# Patient Record
Sex: Female | Born: 1980 | Hispanic: No | Marital: Single | Smoking: Current every day smoker
Health system: Southern US, Community
[De-identification: ages and names within clinical notes are randomized; demographics above are authoritative.]

## PROBLEM LIST (undated history)

## (undated) DIAGNOSIS — E079 Disorder of thyroid, unspecified: Secondary | ICD-10-CM

---

## 2017-11-05 ENCOUNTER — Ambulatory Visit (INDEPENDENT_AMBULATORY_CARE_PROVIDER_SITE_OTHER): Payer: BLUE CROSS/BLUE SHIELD

## 2017-11-05 ENCOUNTER — Encounter (HOSPITAL_COMMUNITY): Payer: Self-pay | Admitting: *Deleted

## 2017-11-05 ENCOUNTER — Ambulatory Visit (HOSPITAL_COMMUNITY)
Admission: EM | Admit: 2017-11-05 | Discharge: 2017-11-05 | Disposition: A | Discharge status: Home / Self Care | Payer: BLUE CROSS/BLUE SHIELD | Attending: Family Medicine | Admitting: Family Medicine

## 2017-11-05 DIAGNOSIS — M5442 Lumbago with sciatica, left side: Secondary | ICD-10-CM

## 2017-11-05 DIAGNOSIS — M542 Cervicalgia: Secondary | ICD-10-CM

## 2017-11-05 DIAGNOSIS — G8929 Other chronic pain: Secondary | ICD-10-CM

## 2017-11-05 HISTORY — DX: Disorder of thyroid, unspecified: E07.9

## 2017-11-05 MED ORDER — KETOROLAC TROMETHAMINE 30 MG/ML IJ SOLN
30.0000 mg | Freq: Once | INTRAMUSCULAR | Status: AC
  Administered 2017-11-05: 30 mg via INTRAMUSCULAR

## 2017-11-05 MED ORDER — PREDNISONE 10 MG (21) PO TBPK: ORAL_TABLET | ORAL | 0 refills | Status: AC

## 2017-11-05 MED ORDER — KETOROLAC TROMETHAMINE 30 MG/ML IJ SOLN
INTRAMUSCULAR | Status: AC
  Filled 2017-11-05: qty 1

## 2017-11-05 MED ORDER — MELOXICAM 7.5 MG PO TABS: 7.5000 mg | ORAL_TABLET | Freq: Every day | ORAL | 0 refills | Status: AC

## 2017-11-05 NOTE — ED Triage Notes (Signed)
Patient reports left lower back pain and left leg pain. Reports this has been going on for over year. Had been seeing a chiropractor but has not been able to afford lately. Patient reports 3 bulging discs. Patient reports intermittent shooting pain down left leg. States currently she has lower and mid central pain-feels like a sharp cutting feeling. Pt has been taking ibuprofen for pain relief.

## 2017-11-05 NOTE — ED Provider Notes (Addendum)
[facility]    CSN: [id number] Arrival date & time: 11/05/17  1113     History   Chief Complaint Chief Complaint  Patient presents with  . Back Pain    HPI Kimberly Stout is a 37 y.o. female.   Patient is a 37 year old female presents with chronic back pain.  This pain is been ongoing for over a year.  She was previously seeing a chiropractor for the adjustments that helped with the pain.  She has had x-rays in the past that confirmed bulging disks.  She has been unable to see her chiropractor due to insurance issues and expense.  Reports worsening lumbar and cervical pain over the past 2 weeks.  She has had some radiation into left upper thigh. Sharp, stabbing in nature.   She has some numbness and tingling in the fingers and toes at times. She contributes this to standing long hours.   She denies any loss of bowel or bladder function. She denies any fever, chills, dysuria, hematuria, urinary frequency,  vaginal bleeding or vaginal discharge. She denies any rashes or color change.   She works long hours and stands about 9-10 of those hours daily. She has been taking ibuprofen for the pain with minimal relief.   ROS per HPI      Past Medical History:  Diagnosis Date  . Thyroid disease     There are no active problems to display for this patient.   History reviewed. No pertinent surgical history.  OB History   None      Home Medications    Prior to Admission medications   Medication Sig Start Date End Date Taking? Authorizing Provider  ibuprofen (ADVIL,MOTRIN) 800 MG tablet Take 800 mg by mouth every 8 (eight) hours as needed.   Yes [provider]  traZODone (DESYREL) 150 MG tablet Take 200 mg by mouth at bedtime.   Yes [provider]  meloxicam (MOBIC) 7.5 MG tablet Take 1 tablet (7.5 mg total) by mouth daily. 11/05/17   Jadore Mcguffin, Gloris Manchester A, NP  predniSONE (STERAPRED UNI-PAK 21 TAB) 10 MG (21) TBPK tablet 6 tabs for 1 day, then 5 tabs for 1 das,  then 4 tabs for 1 day, then 3 tabs for 1 day, 2 tabs for 1 day, then 1 tab for 1 day 11/05/17   Janace Aris, NP    Family History Family History  Problem Relation Age of Onset  . Cancer Mother   . Hypertension Father     Social History Social History   Tobacco Use  . Smoking status: Current Every Day Smoker    Types: E-cigarettes  . Smokeless tobacco: Never Used  Substance Use Topics  . Alcohol use: Not Currently  . Drug use: Not on file     Allergies   Patient has no known allergies.   Review of Systems Review of Systems   Physical Exam Triage Vital Signs ED Triage Vitals  Enc Vitals Group     BP 11/05/17 1236 134/90     Pulse Rate 11/05/17 1236 78     Resp 11/05/17 1236 17     Temp 11/05/17 1236 97.8 F (36.6 C)     Temp Source 11/05/17 1236 Oral     SpO2 11/05/17 1236 100 %     Weight --      Height --      Head Circumference --      Peak Flow --      Pain Score 11/05/17  1232 7     Pain Loc --      Pain Edu? --      Excl. in GC? --    No data found.  Updated Vital Signs BP 134/90 (BP Location: Right Arm)   Pulse 78   Temp 97.8 F (36.6 C) (Oral)   Resp 17   LMP 11/05/2017 (Exact Date)   SpO2 100%   Visual Acuity Right Eye Distance:   Left Eye Distance:   Bilateral Distance:    Right Eye Near:   Left Eye Near:    Bilateral Near:     Physical Exam  Constitutional: She appears well-developed and well-nourished.  Very pleasant. Non toxic or ill appearing.     HENT:  Head: Normocephalic and atraumatic.  Eyes: Conjunctivae are normal.  Neck: Normal range of motion. Neck supple.  mildly tender to cervical spine.  Good ROM   Cardiovascular: Normal rate and regular rhythm.  Pulmonary/Chest: Effort normal and breath sounds normal.  Musculoskeletal: Normal range of motion. She exhibits tenderness. She exhibits no edema or deformity.  Good flexion, extension and rotation of the thoracic and lumbar spine. Mildly tender to lumbar spine and  paravertebral muscles. No swelling, bruising, deformity. No erythema. Sensation intact.   Neurological: She is alert. No sensory deficit.  Skin: Skin is warm and dry.  Psychiatric: She has a normal mood and affect.  Nursing note and vitals reviewed.    UC Treatments / Results  Labs (all labs ordered are listed, but only abnormal results are displayed) Labs Reviewed - No data to display  EKG None  Radiology Dg Cervical Spine Complete  Result Date: 11/05/2017 CLINICAL DATA:  Chronic neck pain without known injury. EXAM: CERVICAL SPINE - COMPLETE 4+ VIEW COMPARISON:  None. FINDINGS: There is no evidence of cervical spine fracture or prevertebral soft tissue swelling. Alignment is normal. No other significant bone abnormalities are identified. IMPRESSION: Negative cervical spine radiographs. Electronically Signed   By: Lupita Raider, M.D.   On: 11/05/2017 14:00   Dg Lumbar Spine Complete  Result Date: 11/05/2017 CLINICAL DATA:  Chronic greater than 1 year history of low back pain which acutely worsened approximately 2 weeks ago. No known injuries. EXAM: LUMBAR SPINE - COMPLETE 4+ VIEW COMPARISON:  None. FINDINGS: Five non rib-bearing lumbar vertebrae with anatomic alignment. No fractures. Well-preserved disk spaces. No pars defects. No significant facet arthropathy. No significant spondylosis. Visualized sacroiliac joints intact. IMPRESSION: Normal examination. Electronically Signed   By: Hulan Saas M.D.   On: 11/05/2017 14:11    Procedures Procedures (including critical care time)  Medications Ordered in UC Medications  ketorolac (TORADOL) 30 MG/ML injection 30 mg (30 mg Intramuscular Given 11/05/17 1411)    Initial Impression / Assessment and Plan / UC Course  I have reviewed the triage vital signs and the nursing notes.  Pertinent labs & imaging results that were available during my care of the patient were reviewed by me and considered in my medical decision making (see  chart for details).     X ray due to hx of bulging disc and new cervical pain. X rays normal Cervical and lumbar pain with radiculopathy Toradol injection in the clinic Prednisone taper and meloxicam to start if pain continues after the taper is finished.  She may need to return to chiropractor since that was helping or physical therapy.  If the pain continues she was instructed to follow up with PCP for more imaging and evaluation.    Final  Clinical Impressions(s) / UC Diagnoses   Final diagnoses:  Chronic bilateral low back pain with left-sided sciatica     Discharge Instructions     Your x-rays were normal We will treat you with prednisone for the next 6 days to see if this helps Once you are done with the prednisone you can start taking meloxicam if you are still having discomfort Stretching and heat/ice could help Try to get back into your chiropractor or possibly physical therapy for continued or worsening symptoms.  Follow up as needed for continued or worsening symptoms      ED Prescriptions    Medication Sig Dispense Auth. Provider   predniSONE (STERAPRED UNI-PAK 21 TAB) 10 MG (21) TBPK tablet 6 tabs for 1 day, then 5 tabs for 1 das, then 4 tabs for 1 day, then 3 tabs for 1 day, 2 tabs for 1 day, then 1 tab for 1 day 21 tablet Sharnell Knight A, NP   meloxicam (MOBIC) 7.5 MG tablet Take 1 tablet (7.5 mg total) by mouth daily. 15 tablet Janace Aris, NP     Controlled Substance Prescriptions [place] Controlled Substance Registry consulted? no   Janace Aris, NP 11/05/17 2126    Janace Aris, NP 11/05/17 2129

## 2017-11-05 NOTE — Discharge Instructions (Addendum)
Your x-rays were normal We will treat you with prednisone for the next 6 days to see if this helps Once you are done with the prednisone you can start taking meloxicam if you are still having discomfort Stretching and heat/ice could help Try to get back into your chiropractor or possibly physical therapy for continued or worsening symptoms.  Follow up as needed for continued or worsening symptoms

## 2017-11-05 NOTE — ED Notes (Signed)
Patient transported to X-ray traci bast , np aware of medication delay and why

## 2017-11-05 NOTE — ED Notes (Signed)
Patient transported to X-ray.  Patient is not in treatment room.   

## 2018-01-29 ENCOUNTER — Emergency Department (HOSPITAL_BASED_OUTPATIENT_CLINIC_OR_DEPARTMENT_OTHER)
Admission: EM | Admit: 2018-01-29 | Discharge: 2018-01-29 | Disposition: A | Discharge status: Home / Self Care | Payer: BLUE CROSS/BLUE SHIELD | Attending: Emergency Medicine | Admitting: Emergency Medicine

## 2018-01-29 ENCOUNTER — Encounter (HOSPITAL_BASED_OUTPATIENT_CLINIC_OR_DEPARTMENT_OTHER): Payer: Self-pay | Admitting: *Deleted

## 2018-01-29 ENCOUNTER — Other Ambulatory Visit: Payer: Self-pay

## 2018-01-29 ENCOUNTER — Emergency Department (HOSPITAL_BASED_OUTPATIENT_CLINIC_OR_DEPARTMENT_OTHER): Payer: BLUE CROSS/BLUE SHIELD

## 2018-01-29 DIAGNOSIS — R05 Cough: Secondary | ICD-10-CM | POA: Insufficient documentation

## 2018-01-29 DIAGNOSIS — Z5321 Procedure and treatment not carried out due to patient leaving prior to being seen by health care provider: Secondary | ICD-10-CM | POA: Insufficient documentation

## 2018-01-29 NOTE — ED Triage Notes (Signed)
Cough, body aches for over 2 weeks.

## 2019-06-14 ENCOUNTER — Other Ambulatory Visit: Payer: Self-pay | Admitting: Family Medicine

## 2019-06-14 DIAGNOSIS — E041 Nontoxic single thyroid nodule: Secondary | ICD-10-CM

## 2019-06-24 ENCOUNTER — Other Ambulatory Visit: Payer: Self-pay

## 2019-06-29 ENCOUNTER — Other Ambulatory Visit: Payer: Self-pay | Admitting: Family Medicine

## 2019-06-29 DIAGNOSIS — M545 Low back pain, unspecified: Secondary | ICD-10-CM

## 2019-06-29 DIAGNOSIS — R519 Headache, unspecified: Secondary | ICD-10-CM

## 2019-06-29 DIAGNOSIS — M542 Cervicalgia: Secondary | ICD-10-CM

## 2019-06-29 DIAGNOSIS — M546 Pain in thoracic spine: Secondary | ICD-10-CM

## 2019-07-31 ENCOUNTER — Other Ambulatory Visit: Payer: Self-pay

## 2019-07-31 ENCOUNTER — Ambulatory Visit
Admission: RE | Admit: 2019-07-31 | Discharge: 2019-07-31 | Disposition: A | Discharge status: Home / Self Care | Payer: No Typology Code available for payment source | Source: Ambulatory Visit | Attending: Family Medicine | Admitting: Family Medicine

## 2019-07-31 DIAGNOSIS — M546 Pain in thoracic spine: Secondary | ICD-10-CM

## 2019-07-31 DIAGNOSIS — R519 Headache, unspecified: Secondary | ICD-10-CM

## 2019-07-31 DIAGNOSIS — M542 Cervicalgia: Secondary | ICD-10-CM

## 2019-07-31 DIAGNOSIS — M545 Low back pain, unspecified: Secondary | ICD-10-CM

## 2019-07-31 MED ORDER — GADOBENATE DIMEGLUMINE 529 MG/ML IV SOLN
15.0000 mL | Freq: Once | INTRAVENOUS | Status: AC | PRN
  Administered 2019-07-31: 15 mL via INTRAVENOUS

## 2019-10-12 IMAGING — DX DG CERVICAL SPINE COMPLETE 4+V
6 series · 6 of 6 positions shown · non-contrast
Comparison: None.

CLINICAL DATA: Chronic neck pain without known injury.

EXAM:
CERVICAL SPINE - COMPLETE 4+ VIEW

[c-spine lat]
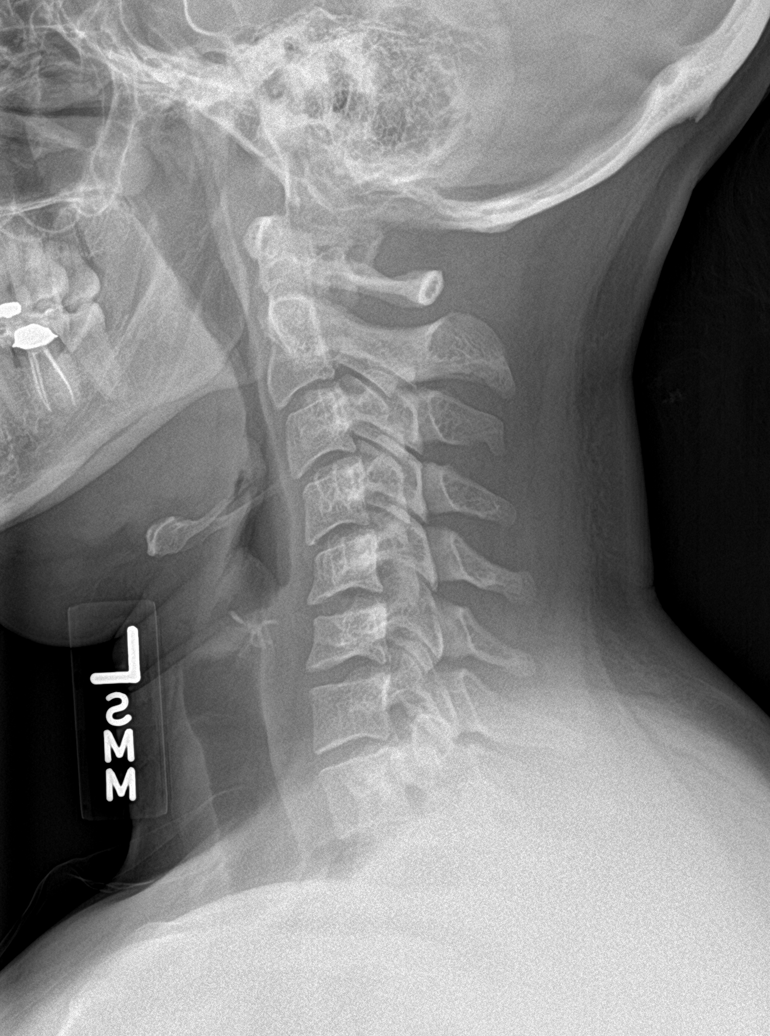

[c-spine obl (1 of 2)]
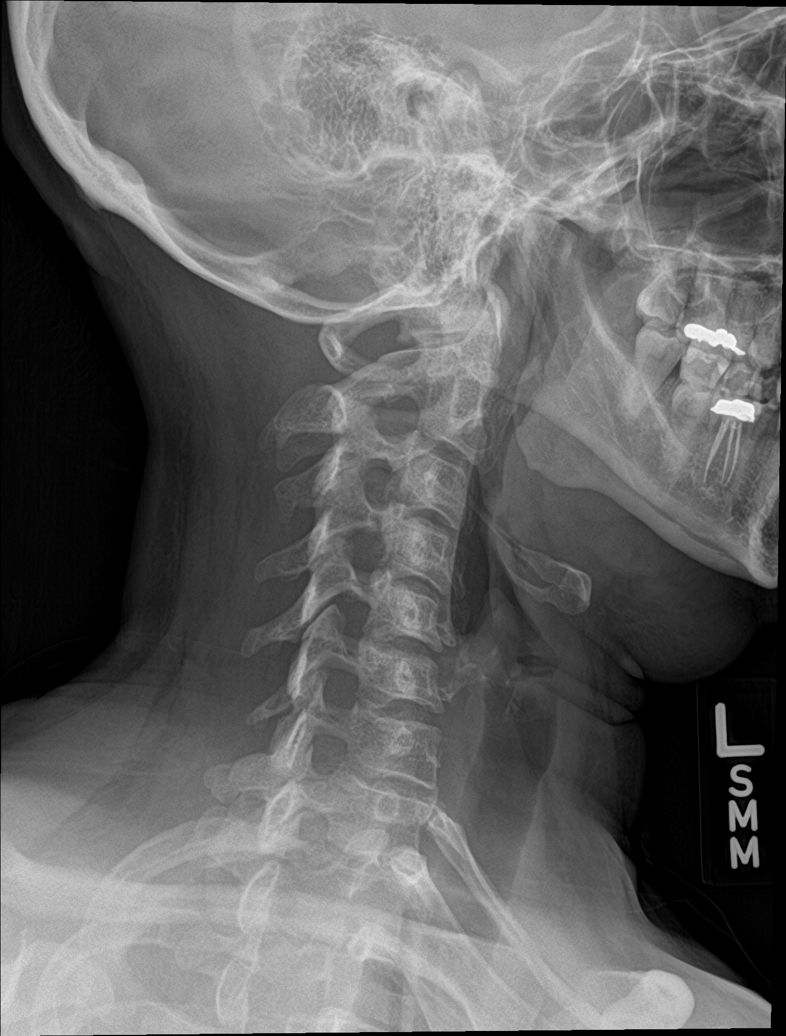

[c-spine obl (2 of 2)]
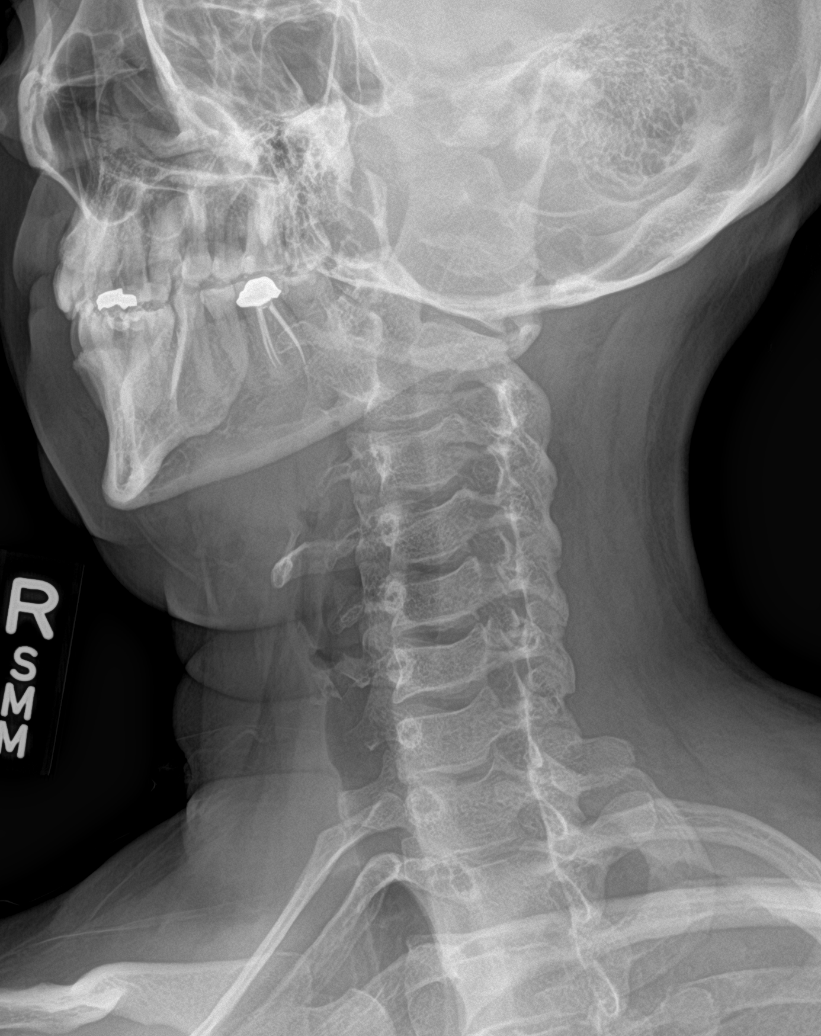

[c-spine ap]
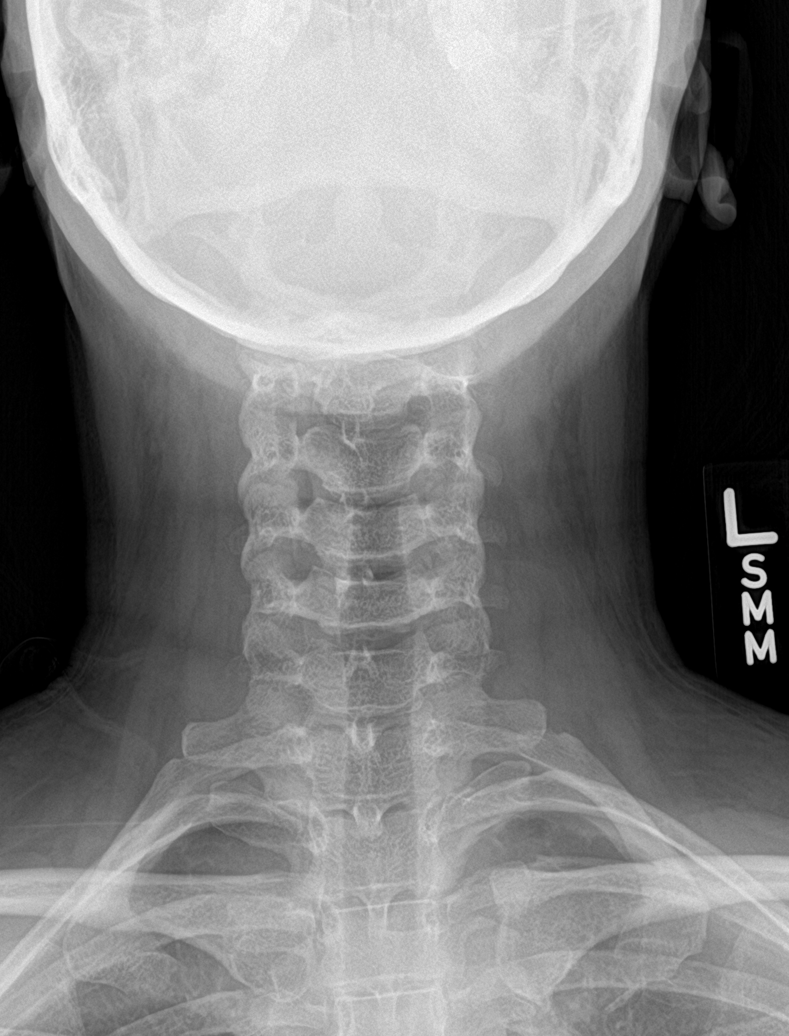

[c-spine open mouth (1 of 2)]
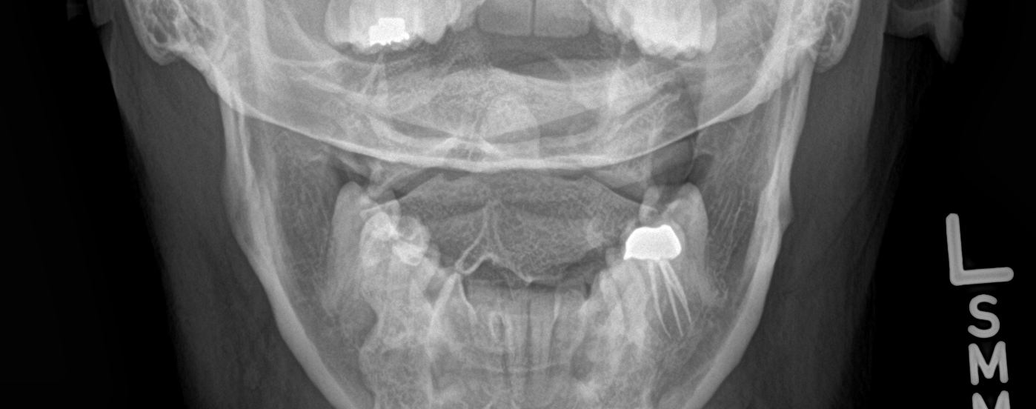

[c-spine open mouth (2 of 2)]
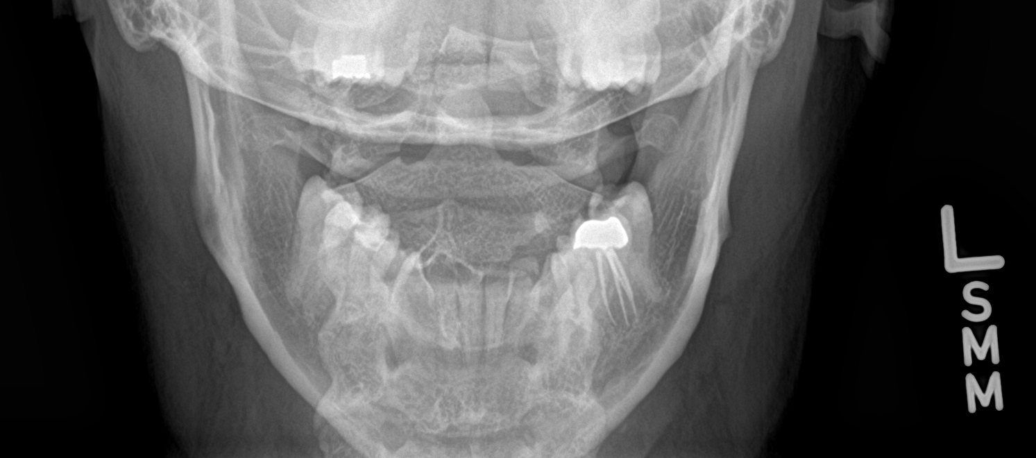

[6 of 6 positions shown; findings below may reference images not displayed]

FINDINGS: There is no evidence of cervical spine fracture or prevertebral soft
tissue swelling. Alignment is normal. No other significant bone
abnormalities are identified.
IMPRESSION: Negative cervical spine radiographs.
